# Patient Record
Sex: Female | Born: 1985 | Hispanic: Yes | Marital: Single | Smoking: Never smoker
Health system: Southern US, Community
[De-identification: ages and names within clinical notes are randomized; demographics above are authoritative.]

---

## 2005-11-22 ENCOUNTER — Emergency Department: Payer: Self-pay | Admitting: Emergency Medicine

## 2010-09-03 ENCOUNTER — Emergency Department: Payer: Self-pay | Admitting: Emergency Medicine

## 2010-10-23 ENCOUNTER — Emergency Department: Payer: Self-pay | Admitting: Emergency Medicine

## 2013-03-03 ENCOUNTER — Emergency Department: Payer: Self-pay | Admitting: Emergency Medicine

## 2014-10-24 ENCOUNTER — Emergency Department: Payer: Self-pay | Admitting: Emergency Medicine

## 2014-10-24 LAB — URINALYSIS, COMPLETE
Bacteria: NONE SEEN
Bilirubin,UR: NEGATIVE
Blood: NEGATIVE
Glucose,UR: NEGATIVE mg/dL (ref 0–75)
Ketone: NEGATIVE
Leukocyte Esterase: NEGATIVE
Nitrite: NEGATIVE
PROTEIN: NEGATIVE
Ph: 7 (ref 4.5–8.0)
RBC,UR: NONE SEEN /HPF (ref 0–5)
SPECIFIC GRAVITY: 1.019 (ref 1.003–1.030)
Squamous Epithelial: 2
WBC UR: NONE SEEN /HPF (ref 0–5)

## 2015-02-07 ENCOUNTER — Emergency Department: Payer: Self-pay | Admitting: Emergency Medicine

## 2015-07-18 ENCOUNTER — Other Ambulatory Visit: Payer: Self-pay | Admitting: Infectious Diseases

## 2015-07-18 ENCOUNTER — Ambulatory Visit
Admission: RE | Admit: 2015-07-18 | Discharge: 2015-07-18 | Disposition: A | Discharge status: Home / Self Care | Payer: PRIVATE HEALTH INSURANCE | Source: Ambulatory Visit | Attending: Infectious Diseases | Admitting: Infectious Diseases

## 2015-07-18 DIAGNOSIS — R7611 Nonspecific reaction to tuberculin skin test without active tuberculosis: Secondary | ICD-10-CM | POA: Insufficient documentation

## 2016-03-02 ENCOUNTER — Encounter: Payer: Self-pay | Admitting: Emergency Medicine

## 2016-03-02 DIAGNOSIS — N83201 Unspecified ovarian cyst, right side: Secondary | ICD-10-CM | POA: Insufficient documentation

## 2016-03-02 DIAGNOSIS — N939 Abnormal uterine and vaginal bleeding, unspecified: Secondary | ICD-10-CM | POA: Insufficient documentation

## 2016-03-02 LAB — BASIC METABOLIC PANEL
ANION GAP: 4 — AB (ref 5–15)
BUN: 27 mg/dL — ABNORMAL HIGH (ref 6–20)
CALCIUM: 9.1 mg/dL (ref 8.9–10.3)
CO2: 25 mmol/L (ref 22–32)
CREATININE: 0.55 mg/dL (ref 0.44–1.00)
Chloride: 107 mmol/L (ref 101–111)
Glucose, Bld: 106 mg/dL — ABNORMAL HIGH (ref 65–99)
Potassium: 3.4 mmol/L — ABNORMAL LOW (ref 3.5–5.1)
SODIUM: 136 mmol/L (ref 135–145)

## 2016-03-02 LAB — URINALYSIS COMPLETE WITH MICROSCOPIC (ARMC ONLY)
BILIRUBIN URINE: NEGATIVE
Bacteria, UA: NONE SEEN
GLUCOSE, UA: NEGATIVE mg/dL
KETONES UR: NEGATIVE mg/dL
LEUKOCYTES UA: NEGATIVE
NITRITE: NEGATIVE
PH: 6 (ref 5.0–8.0)
Protein, ur: NEGATIVE mg/dL
Specific Gravity, Urine: 1.02 (ref 1.005–1.030)

## 2016-03-02 LAB — CBC
HEMATOCRIT: 36.8 % (ref 35.0–47.0)
HEMOGLOBIN: 12.4 g/dL (ref 12.0–16.0)
MCH: 29.8 pg (ref 26.0–34.0)
MCHC: 33.8 g/dL (ref 32.0–36.0)
MCV: 88.1 fL (ref 80.0–100.0)
Platelets: 268 10*3/uL (ref 150–440)
RBC: 4.18 MIL/uL (ref 3.80–5.20)
RDW: 12.6 % (ref 11.5–14.5)
WBC: 11.1 10*3/uL — ABNORMAL HIGH (ref 3.6–11.0)

## 2016-03-02 NOTE — ED Notes (Signed)
Pt in with co vaginal bleeding states 3/22 had cryotherapy to cervix for abnormal cells.  States today her bleeding increased and co left lower abd pain.

## 2016-03-03 ENCOUNTER — Encounter: Payer: Self-pay | Admitting: Emergency Medicine

## 2016-03-03 ENCOUNTER — Emergency Department: Payer: Self-pay

## 2016-03-03 ENCOUNTER — Emergency Department
Admission: EM | Admit: 2016-03-03 | Discharge: 2016-03-03 | Disposition: A | Discharge status: Home / Self Care | Payer: Self-pay | Attending: Emergency Medicine | Admitting: Emergency Medicine

## 2016-03-03 DIAGNOSIS — N939 Abnormal uterine and vaginal bleeding, unspecified: Secondary | ICD-10-CM

## 2016-03-03 DIAGNOSIS — N83201 Unspecified ovarian cyst, right side: Secondary | ICD-10-CM

## 2016-03-03 DIAGNOSIS — R1030 Lower abdominal pain, unspecified: Secondary | ICD-10-CM

## 2016-03-03 LAB — CHLAMYDIA/NGC RT PCR (ARMC ONLY)
Chlamydia Tr: NOT DETECTED
N GONORRHOEAE: NOT DETECTED

## 2016-03-03 LAB — WET PREP, GENITAL
Clue Cells Wet Prep HPF POC: NONE SEEN
SPERM: NONE SEEN
Trich, Wet Prep: NONE SEEN
YEAST WET PREP: NONE SEEN

## 2016-03-03 LAB — PREGNANCY, URINE: PREG TEST UR: NEGATIVE

## 2016-03-03 MED ORDER — DIATRIZOATE MEGLUMINE & SODIUM 66-10 % PO SOLN
15.0000 mL | ORAL | Status: AC
  Administered 2016-03-03: 15 mL via ORAL

## 2016-03-03 MED ORDER — MORPHINE SULFATE (PF) 4 MG/ML IV SOLN
4.0000 mg | Freq: Once | INTRAVENOUS | Status: AC
  Administered 2016-03-03: 4 mg via INTRAVENOUS
  Filled 2016-03-03: qty 1

## 2016-03-03 MED ORDER — IOPAMIDOL (ISOVUE-300) INJECTION 61%
100.0000 mL | Freq: Once | INTRAVENOUS | Status: AC | PRN
  Administered 2016-03-03: 100 mL via INTRAVENOUS

## 2016-03-03 MED ORDER — ONDANSETRON HCL 4 MG/2ML IJ SOLN
4.0000 mg | Freq: Once | INTRAMUSCULAR | Status: AC
  Administered 2016-03-03: 4 mg via INTRAVENOUS
  Filled 2016-03-03: qty 2

## 2016-03-03 MED ORDER — DIATRIZOATE MEGLUMINE & SODIUM 66-10 % PO SOLN: 15.0000 mL | Freq: Once | ORAL | Status: DC

## 2016-03-03 MED ORDER — AMOXICILLIN-POT CLAVULANATE 875-125 MG PO TABS: 1.0000 | ORAL_TABLET | Freq: Two times a day (BID) | ORAL | Status: AC

## 2016-03-03 MED ORDER — IBUPROFEN 800 MG PO TABS: 800.0000 mg | ORAL_TABLET | Freq: Three times a day (TID) | ORAL | Status: AC | PRN

## 2016-03-03 MED ORDER — OXYCODONE-ACETAMINOPHEN 5-325 MG PO TABS: 1.0000 | ORAL_TABLET | Freq: Four times a day (QID) | ORAL | Status: AC | PRN

## 2016-03-03 NOTE — ED Notes (Signed)
Pt to ct scan at this time.

## 2016-03-03 NOTE — ED Provider Notes (Signed)
[facility] Emergency Department Provider Note  ____________________________________________  Time seen: Approximately 3:17 AM  I have reviewed the triage vital signs and the nursing notes.   HISTORY  Chief Complaint Abdominal Pain    HPI Crystal Montgomery is a 30 y.o. female comes into the hospital today with vaginal bleeding. The patient reports that the bleeding became very heavy around 8 PM. She reports that her last menstrual period was in December and she is unsure how many pads she soaked before coming in. She reports that it seemed as though someone opened a faucet and blood was pouring out of her. She reports that when she arrived the bleeding subsided. She reports that she also has some abdominal pain that sent out of 10 in intensity at its worst in her left lower abdomen. She reports that she hasn't OB/GYN at [facility]. She reports that she had a Pap smear done and was sent to [facility] on February 22 when they found some abnormal cells. She reports that she had a colposcopy done and was told that her cells were abnormal. She had another procedure done on March 22 took out a larger sample of cells and burned the inside of her uterus. She reports that she was told that there would be some bleeding but not this heavy. Patient took some ibuprofen for her pain but reports that did not help. The patient is here for evaluation. She's had similar pain with an ovarian cyst previously but reports that she's not sure if this is the same thing her something different related to her procedure.   History reviewed. No pertinent past medical history.  There are no active problems to display for this patient.   History reviewed. No pertinent past surgical history.  No current outpatient prescriptions on file.  Allergies Review of patient's allergies indicates no known allergies.  No family history on file.  Social History Social History  Substance Use Topics  .  Smoking status: Never Smoker   . Smokeless tobacco: None  . Alcohol Use: No    Review of Systems Constitutional: No fever/chills Eyes: No visual changes. ENT: No sore throat. Cardiovascular: Denies chest pain. Respiratory: Denies shortness of breath. Gastrointestinal: abdominal pain.  No nausea, no vomiting.  No diarrhea.  No constipation. Genitourinary: Vaginal bleeding Musculoskeletal: Negative for back pain. Skin: Negative for rash. Neurological: Negative for headaches, focal weakness or numbness.  10-point ROS otherwise negative.  ____________________________________________   PHYSICAL EXAM:  VITAL SIGNS: ED Triage Vitals  Enc Vitals Group     BP 03/02/16 2324 119/83 mmHg     Pulse Rate 03/02/16 2324 66     Resp 03/02/16 2324 18     Temp 03/02/16 2324 97.8 F (36.6 C)     Temp Source 03/02/16 2324 Oral     SpO2 03/02/16 2324 96 %     Weight 03/02/16 2324 120 lb (54.432 kg)     Height 03/02/16 2324 5' (1.524 m)     Head Cir --      Peak Flow --      Pain Score 03/02/16 2325 8     Pain Loc --      Pain Edu? --      Excl. in GC? --     Constitutional: Alert and oriented. Well appearing and in watery distress. Eyes: Conjunctivae are normal. PERRL. EOMI. Head: Atraumatic. Nose: No congestion/rhinnorhea. Mouth/Throat: Mucous membranes are moist.  Oropharynx non-erythematous. Cardiovascular: Normal rate, regular rhythm. Grossly normal heart  sounds.  Good peripheral circulation. Respiratory: Normal respiratory effort.  No retractions. Lungs CTAB. Gastrointestinal: Soft diffuse lower abdominal tenderness to palpation. Mild lower abdomen distention. Active bowel sounds Genitourinary: Normal external genitalia, eschar noted to cervical area, cervical motion tenderness, uterine and bilateral adnexal tenderness to palpation Musculoskeletal: No lower extremity tenderness nor edema.  Neurologic:  Normal speech and language.  Skin:  Skin is warm, dry and intact.   Psychiatric: Mood and affect are normal.   ____________________________________________   LABS (all labs ordered are listed, but only abnormal results are displayed)  Labs Reviewed  WET PREP, GENITAL - Abnormal; Notable for the following:    WBC, Wet Prep HPF POC FEW (*)    All other components within normal limits  CBC - Abnormal; Notable for the following:    WBC 11.1 (*)    All other components within normal limits  BASIC METABOLIC PANEL - Abnormal; Notable for the following:    Potassium 3.4 (*)    Glucose, Bld 106 (*)    BUN 27 (*)    Anion gap 4 (*)    All other components within normal limits  URINALYSIS COMPLETEWITH MICROSCOPIC ([facility] ONLY) - Abnormal; Notable for the following:    Color, Urine YELLOW (*)    APPearance CLEAR (*)    Hgb urine dipstick 2+ (*)    Squamous Epithelial / LPF 0-5 (*)    All other components within normal limits  CHLAMYDIA/NGC RT PCR ([facility] ONLY)  PREGNANCY, URINE   ____________________________________________  EKG  None ____________________________________________  RADIOLOGY  Pelvic ultrasound: No acute abnormality seen to explain the patient's symptoms, no evidence for ovarian torsion, trace fluid noted at the cervical canal. ____________________________________________   PROCEDURES  Procedure(s) performed: None  Critical Care performed: No  ____________________________________________   INITIAL IMPRESSION / ASSESSMENT AND PLAN / ED COURSE  Pertinent labs & imaging results that were available during my care of the patient were reviewed by me and considered in my medical decision making (see chart for details).  This is a 30 year old female who comes in with severe vaginal bleeding as well as lower abdominal pain that started this evening. The patient had been having some crampy pain previously but reports that this pain is worse. The patient does not have any severe bleeding on exam but I did send her for an  ultrasound to evaluate her symptoms. The patient's blood work at this time is negative. I did give the patient a dose of morphine and Zofran as she was very uncomfortable.  The patient's ultrasound is negative. Given though the patient's amount of pain and her lower abdominal distention I will do a CT scan to evaluate the patient's belly pain. The patient's care was signed out to Dr. Sharma CovertNorman who will follow-up the results of the patient's CT scan and disposition the patient. If the CT scan is negative I will consider treating the patient for endometritis. ____________________________________________   FINAL CLINICAL IMPRESSION(S) / ED DIAGNOSES  Final diagnoses:  Vaginal bleeding  Lower abdominal pain      Rebecka ApleyAllison P Webster, MD 03/03/16 [phone]

## 2016-03-03 NOTE — ED Notes (Signed)
Pt states she has been taking ibuprofen 400mg  for pain control.

## 2016-03-03 NOTE — Discharge Instructions (Signed)
Please take the entire course of antibiotics, even if you're feeling better. You may take Motrin or Tylenol for mild to moderate pain. Take Percocet for severe pain. Do not drive within 8 hours of taking Percocet.  Return to the emergency department if you develop severe pain, inability to keep down fluids, fever, or any other symptoms concerning to you.

## 2016-03-03 NOTE — ED Provider Notes (Signed)
I received sign out about this patient from Dr. Zenda AlpersWebster. 30 year old female who is coming in for vaginal bleeding and lower abdominal pain. She had had a reassuring examination in the emergency department her vital signs are stable, her labs are reassuring, and she has no ultrasound and CT scan which do not show any acute process. I have reevaluated the patient myself, and she is feeling much better. Her abdomen at this point has very minimal suprapubic discomfort but no guarding or rebound, no distention. I will discharge her home with Augmentin for the treatment of endometritis, as well as medications for symptomatically management of her pain. She will follow-up with her gynecologist. She understands return precautions as well as follow-up instructions.  Crystal MenghiniAnne-Caroline Ishi Danser, MD 03/03/16 [phone]

## 2016-03-03 NOTE — ED Notes (Signed)
Pt states she had colposcopy on March 22nd and she was told that she would bleed.  Bleeding has increased and is having at times half dollar sized clots coming out.  Pt states she has had to change her sanitary pad numerous times due to saturation.  Pt had some lightheadedness earlier, but not currently experiencing this.  Pt states the pain is bad around her LLQ of her abdomen by her ovary area.  Pt denies this being a normal time for her period either.

## 2016-07-27 IMAGING — CT CT ABD-PELV W/ CM
1 of 3 series · 14 of 32 positions shown, 18 images · IV contrast (iopamidol)
Comparison: Pelvic ultrasound 03/03/2016

CLINICAL DATA: Lower abdominal pain, vaginal bleeding

EXAM:
CT ABDOMEN AND PELVIS WITH CONTRAST
TECHNIQUE: Multidetector CT imaging of the abdomen and pelvis was performed
using the standard protocol following bolus administration of
intravenous contrast.
CONTRAST:  100mL [id number] IOPAMIDOL (BDKR45-Q33) INJECTION 61%

[Series 2: routine abd pel with · axial · 0.61mm/px · z∈[-878,-508]mm · 14 of 82 slices shown, 18 images]
[im 4/82  soft-tissue]
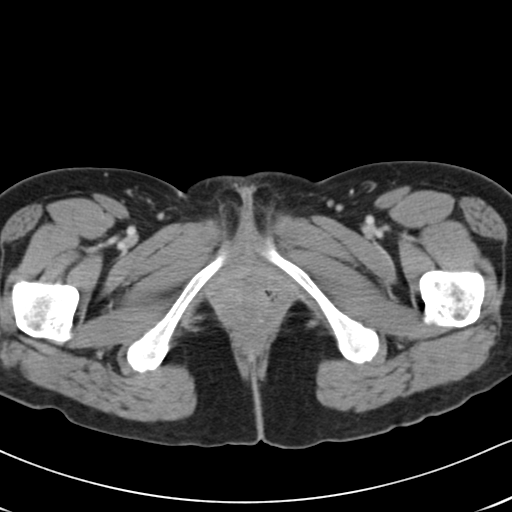
[im 4/82  bone]
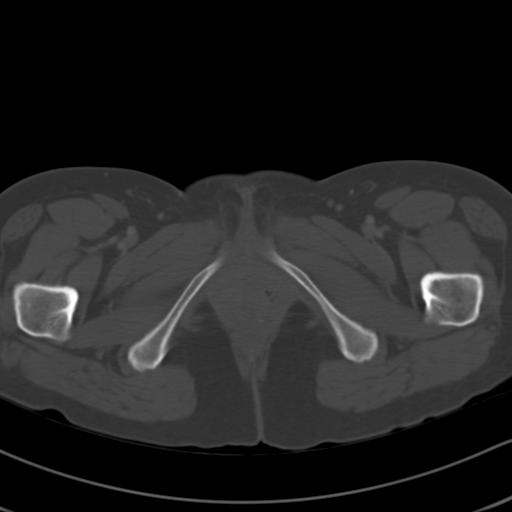
[im 12/82  soft-tissue]
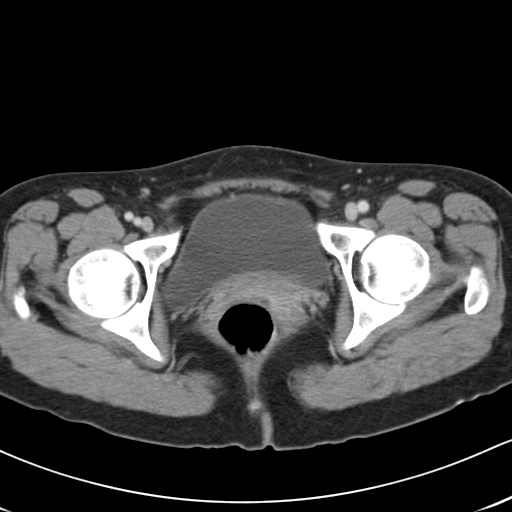
[im 20/82  soft-tissue]
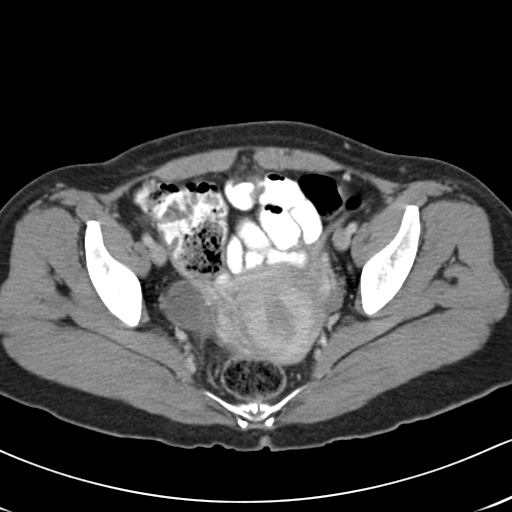
[im 24/82  soft-tissue]
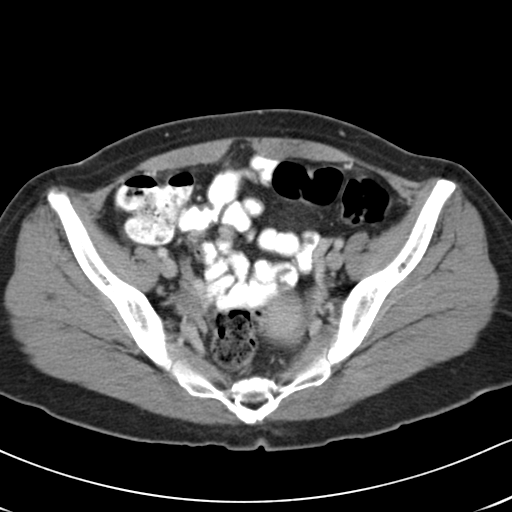
[im 31/82  soft-tissue]
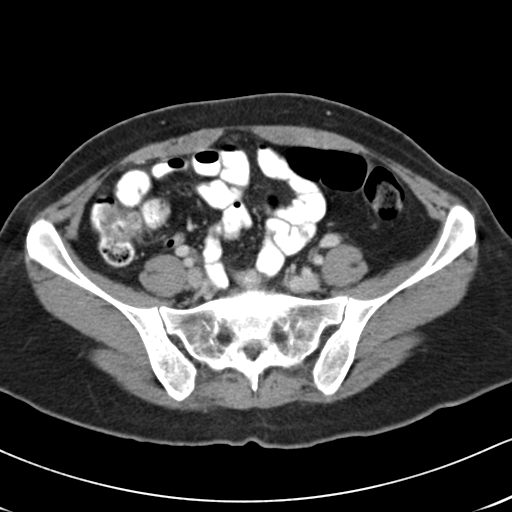
[im 39/82  soft-tissue]
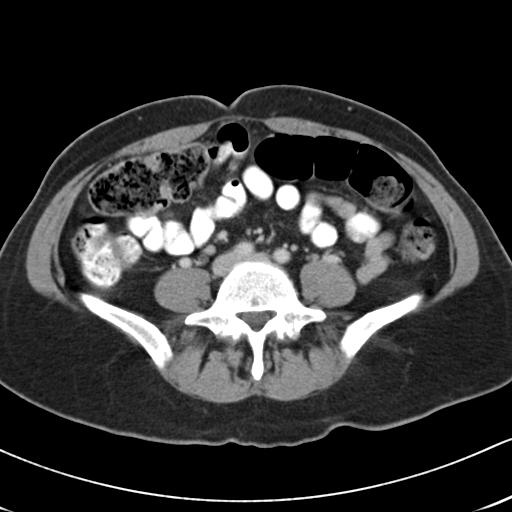
[im 43/82  soft-tissue]
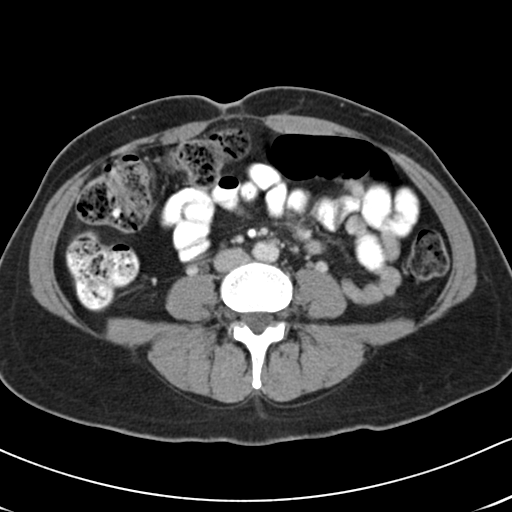
[im 51/82  soft-tissue]
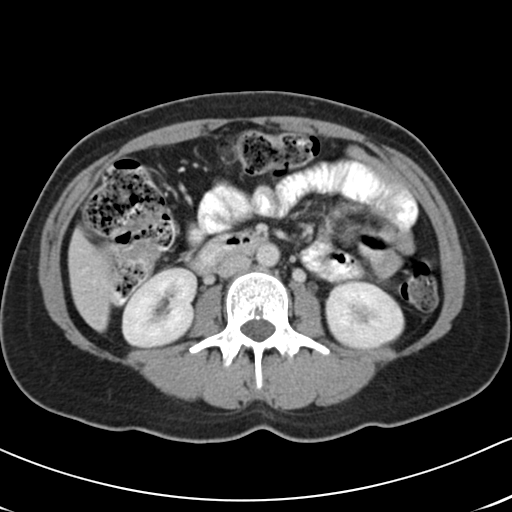
[im 58/82  soft-tissue]
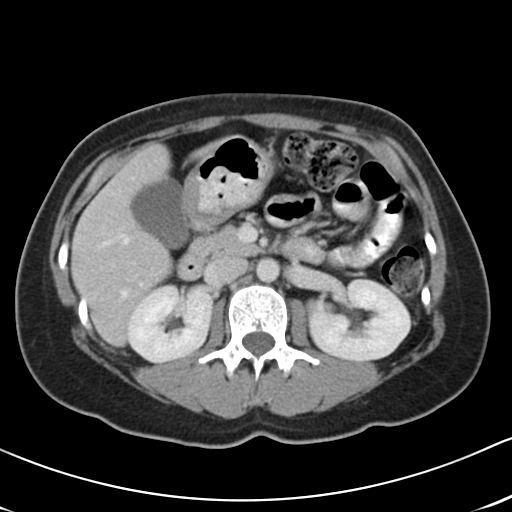
[im 58/82  bone]
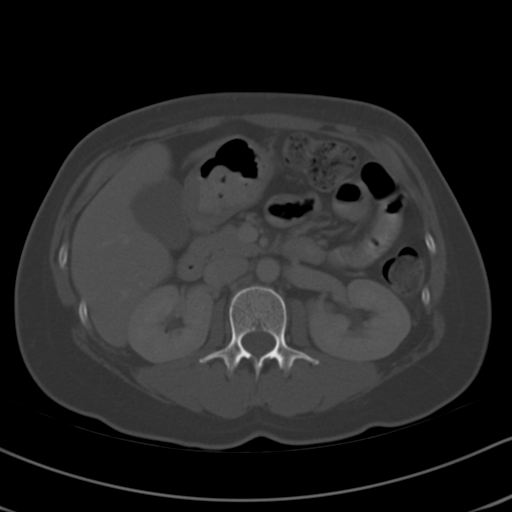
[im 62/82  soft-tissue]
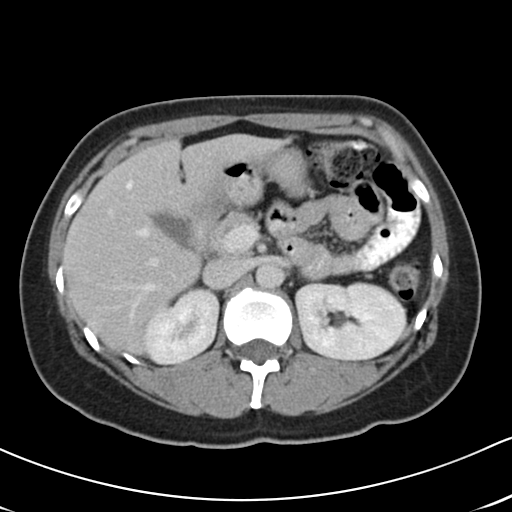
[im 66/82  lung]
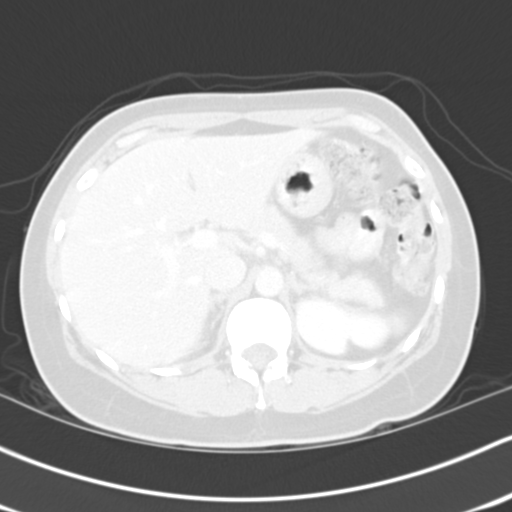
[im 70/82  soft-tissue]
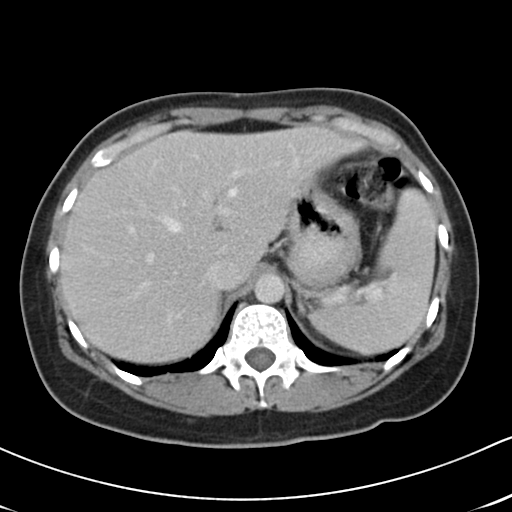
[im 70/82  lung]
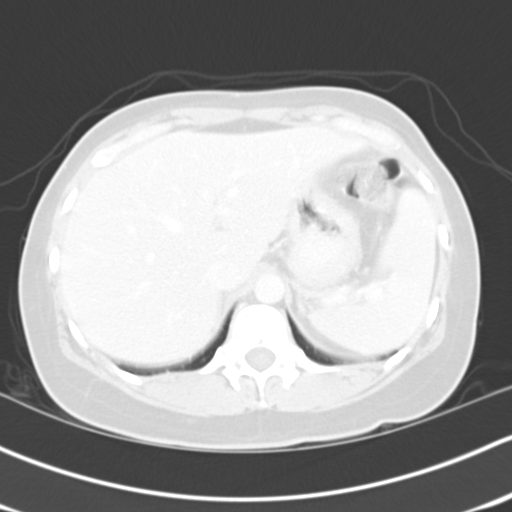
[im 74/82  lung]
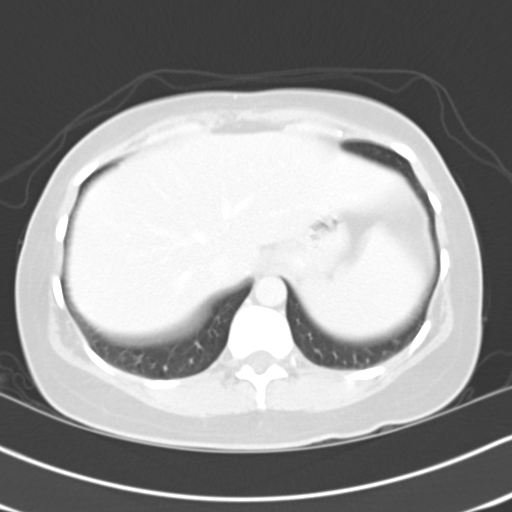
[im 78/82  soft-tissue]
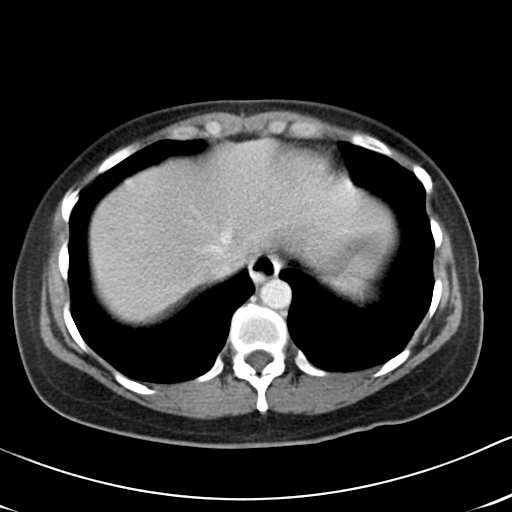
[im 78/82  lung]
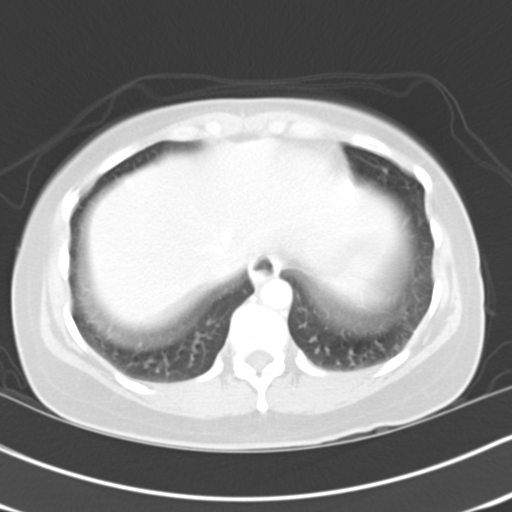

[14 of 32 positions shown; findings below may reference images not displayed]

FINDINGS: Lower chest: Small hiatal hernia is noted. The lung bases are
unremarkable.

Hepatobiliary: Enhanced liver shows no focal mass. No calcified
gallstones are noted within gallbladder. No intrahepatic biliary
ductal dilatation. No CBD dilatation.

Pancreas: No mass, inflammatory changes, or other significant
abnormality.

Spleen: Within normal limits in size and appearance.

Adrenals/Urinary Tract: No adrenal gland mass. Enhanced kidneys are
symmetrical in size. No hydronephrosis or hydroureter.

No focal renal mass. No perinephric stranding. The urinary bladder
is unremarkable. Bilateral visualized ureter is unremarkable.

Stomach/Bowel: No gastric outlet obstruction. No small bowel
obstruction. No thickened or dilated small bowel loops. Normal
appendix is noted in axial image 56. No pericecal inflammation.
Terminal ileum is unremarkable.

There is a low lying cecum. No mesenteric fluid collection is noted.

Vascular/Lymphatic: No aortic aneurysm. IVC is unremarkable. A tiny
calcified retroperitoneal lymph node is noted just anterior to aorta
axial image 39 measures 6 mm.

Reproductive: There is a retroflexed uterus. Small amount of
nonspecific fluid is noted within uterus and endocervical canal.
There is a right ovarian cyst measures 2.8 cm. No pelvic free fluid
is noted.

Other: No ascites or free air.  No inguinal adenopathy.

Musculoskeletal: Sagittal images of the spine are unremarkable. No
destructive bony lesions are noted. No destructive bony lesions are
noted within pelvis.
IMPRESSION: 1. There is no evidence of acute inflammatory process within
abdomen.
2. No hydronephrosis or hydroureter.
3. Moderate stool noted throughout the colon. No small bowel or
colonic obstruction.
4. Low lying cecum.  No pericecal inflammation.  Normal appendix.
5. There is a retroflexed uterus. No adnexal mass. There is a right
ovarian cyst measures 2.8 cm. No pelvic free fluid. Small
nonspecific fluid is noted within uterus and endocervical canal.

## 2018-01-30 ENCOUNTER — Emergency Department
Admission: EM | Admit: 2018-01-30 | Discharge: 2018-01-31 | Disposition: A | Discharge status: Home / Self Care | Payer: PRIVATE HEALTH INSURANCE

## 2018-01-30 NOTE — ED Notes (Signed)
Pt was called x3 by first nurse , Gearldine BienenstockBrandy Rn. No answer

## 2021-08-29 ENCOUNTER — Emergency Department
Admission: EM | Admit: 2021-08-29 | Discharge: 2021-08-29 | Disposition: A | Discharge status: Home / Self Care | Payer: PRIVATE HEALTH INSURANCE | Attending: Emergency Medicine | Admitting: Emergency Medicine

## 2021-08-29 ENCOUNTER — Emergency Department: Payer: PRIVATE HEALTH INSURANCE

## 2021-08-29 ENCOUNTER — Other Ambulatory Visit: Payer: Self-pay

## 2021-08-29 ENCOUNTER — Encounter: Payer: Self-pay | Admitting: Emergency Medicine

## 2021-08-29 DIAGNOSIS — Z3A01 Less than 8 weeks gestation of pregnancy: Secondary | ICD-10-CM | POA: Insufficient documentation

## 2021-08-29 DIAGNOSIS — O200 Threatened abortion: Secondary | ICD-10-CM | POA: Insufficient documentation

## 2021-08-29 DIAGNOSIS — O4690 Antepartum hemorrhage, unspecified, unspecified trimester: Secondary | ICD-10-CM

## 2021-08-29 LAB — CBC WITH DIFFERENTIAL/PLATELET
Abs Immature Granulocytes: 0.12 10*3/uL — ABNORMAL HIGH (ref 0.00–0.07)
Basophils Absolute: 0 10*3/uL (ref 0.0–0.1)
Basophils Relative: 0 %
Eosinophils Absolute: 0.1 10*3/uL (ref 0.0–0.5)
Eosinophils Relative: 1 %
HCT: 36.4 % (ref 36.0–46.0)
Hemoglobin: 12.7 g/dL (ref 12.0–15.0)
Immature Granulocytes: 1 %
Lymphocytes Relative: 26 %
Lymphs Abs: 2.3 10*3/uL (ref 0.7–4.0)
MCH: 31.5 pg (ref 26.0–34.0)
MCHC: 34.9 g/dL (ref 30.0–36.0)
MCV: 90.3 fL (ref 80.0–100.0)
Monocytes Absolute: 0.5 10*3/uL (ref 0.1–1.0)
Monocytes Relative: 5 %
Neutro Abs: 6 10*3/uL (ref 1.7–7.7)
Neutrophils Relative %: 67 %
Platelets: 315 10*3/uL (ref 150–400)
RBC: 4.03 MIL/uL (ref 3.87–5.11)
RDW: 12.3 % (ref 11.5–15.5)
WBC: 9 10*3/uL (ref 4.0–10.5)
nRBC: 0 % (ref 0.0–0.2)

## 2021-08-29 LAB — HCG, QUANTITATIVE, PREGNANCY: hCG, Beta Chain, Quant, S: 9407 m[IU]/mL — ABNORMAL HIGH (ref <5)

## 2021-08-29 LAB — ABO/RH: ABO/RH(D): B POS

## 2021-08-29 NOTE — Discharge Instructions (Signed)
Como comentamos, su ecografa mostr un Psychiatrist de 6 semanas de edad gestacional. En este momento, no hay latido del corazn. Puede deberse a que esto es [address] o a que el embarazo ha fallado y, por lo tanto, est teniendo un aborto espontneo. Es muy importante que se repita el anlisis de sangre y la ecografa en una semana para determinar qu est pasando. Mientras tanto, asegrese de no llevar nada que pese ms de 5 libras, evite las [employer] sexuales y los tampones hasta que transcurran 48 horas sin sangrado. Regrese a la sala de emergencias si tiene sangrado intenso hasta el punto de sentir que se va a Artist, tiene Surveyor, quantity pecho o le falta el aire. Contine tomando vitaminas prenatales. Toma Tylenol para los calambres   As we discussed, your ultrasound showed a pregnancy of [redacted] weeks gestational age.  At this time, there is no heartbeat.  It could be due to the fact that this is too early or to the fact that the pregnancy has failed and therefore you are having a miscarriage.  It is very important that you have repeat blood work and ultrasound in a week to determine what is going on.  In the meantime make sure not to carry anything heavier than 5 pounds, avoid sex and tampons until 48 hours of no bleeding.  Return to the emergency room if you have severe bleeding to the point that you feel like you are going to pass out, have chest pain or shortness of breath.  Continue to take prenatal vitamins.  Take Tylenol for cramping

## 2021-08-29 NOTE — ED Provider Notes (Signed)
[facility] Emergency Department Provider Note  ____________________________________________  Time seen: Approximately 11:33 PM  I have reviewed the triage vital signs and the nursing notes.   HISTORY  Chief Complaint Vaginal Bleeding   HPI Crystal Montgomery is a 35 y.o. female G4P3 at [redacted] weeks GA per LMP who presents for evaluation of vaginal bleeding.  The vaginal bleeding started earlier today.  She was passing large clots and having a lot of cramping in the suprapubic and lower back.  She went to [facility] clinic where they did some blood work to confirm the pregnancy but no imaging.  Patient reports that when she went home the bleeding got more severe which prompted her visit to the emergency room.  She reports that at this time the bleeding has slowed down and the cramping has subsided.  She denies any prior problems with her first 3 pregnancies.  She has no history of bleeding disorder and does not take any blood thinners.   No past medical history on file.  There are no problems to display for this patient.   No past surgical history on file.  Prior to Admission medications   Medication Sig Start Date End Date Taking? Authorizing Provider  ibuprofen (ADVIL,MOTRIN) 800 MG tablet Take 1 tablet (800 mg total) by mouth every 8 (eight) hours as needed. 03/03/16   Rockne Menghini, MD    Allergies Patient has no known allergies.  No family history on file.  Social History Social History   Tobacco Use   Smoking status: Never  Substance Use Topics   Alcohol use: No    Review of Systems  Constitutional: Negative for fever. Eyes: Negative for visual changes. ENT: Negative for sore throat. Neck: No neck pain  Cardiovascular: Negative for chest pain. Respiratory: Negative for shortness of breath. Gastrointestinal: Negative for abdominal pain, vomiting or diarrhea. Genitourinary: Negative for dysuria. + vaginal  bleeding Musculoskeletal: Negative for back pain. Skin: Negative for rash. Neurological: Negative for headaches, weakness or numbness. Psych: No SI or HI  ____________________________________________   PHYSICAL EXAM:  VITAL SIGNS: Vitals:   08/29/21 2108 08/29/21 2341  BP: (!) 177/96 (!) 106/93  Pulse: 64 75  Resp: 16 16  Temp: 98.7 F (37.1 C)   SpO2: 100%      Constitutional: Alert and oriented. Well appearing and in no apparent distress. HEENT:      Head: Normocephalic and atraumatic.         Eyes: Conjunctivae are normal. Sclera is non-icteric.       Mouth/Throat: Mucous membranes are moist.       Neck: Supple with no signs of meningismus. Cardiovascular: Regular rate and rhythm. No murmurs, gallops, or rubs. 2+ symmetrical distal pulses are present in all extremities. No JVD. Respiratory: Normal respiratory effort. Lungs are clear to auscultation bilaterally.  Gastrointestinal: Soft, non tender, and non distended with positive bowel sounds. No rebound or guarding. Genitourinary: No CVA tenderness. Musculoskeletal:  No edema, cyanosis, or erythema of extremities. Neurologic: Normal speech and language. Face is symmetric. Moving all extremities. No gross focal neurologic deficits are appreciated. Skin: Skin is warm, dry and intact. No rash noted. Psychiatric: Mood and affect are normal. Speech and behavior are normal.  ____________________________________________   LABS (all labs ordered are listed, but only abnormal results are displayed)  Labs Reviewed  HCG, QUANTITATIVE, PREGNANCY - Abnormal; Notable for the following components:      Result Value   hCG, Beta Chain, Quant, S 9,407 (*)  All other components within normal limits  CBC WITH DIFFERENTIAL/PLATELET - Abnormal; Notable for the following components:   Abs Immature Granulocytes 0.12 (*)    All other components within normal limits  ABO/RH   ____________________________________________  EKG  none   ____________________________________________  RADIOLOGY  I have personally reviewed the images performed during this visit and I agree with the Radiologist's read.   Interpretation by Radiologist:  US OB LESS THAN 14 WEEKS WITH OB TRANSVAGINAL  Result Date: 08/29/2021 CLINICAL DATA:  Vaginal bleeding. EXAM: OBSTETRIC <14 WK [place] AND TRANSVAGINAL OB US TECHNIQUE: Both transabdominal and transvaginal ultrasound examinations were performed for complete evaluation of the gestation as well as the maternal uterus, adnexal regions, and pelvic cul-de-sac. Transvaginal technique was performed to assess early pregnancy. COMPARISON:  None. FINDINGS: Intrauterine gestational sac: Single Yolk sac:  Visualized. Embryo:  Visualized. Cardiac Activity: Not Visualized. Heart Rate: N/A  bpm CRL:  3.6 mm   6 w   0 d                  [place] [facility]: Apr 24, 2022 Subchorionic hemorrhage:  None visualized. Maternal uterus/adnexae: The right ovary measures 2.4 cm x 1.8 cm x 1.4 cm and is normal in appearance. The left ovary measures 2.6 cm x 2.6 cm x 2.5 cm and is normal in appearance. No pelvic free fluid is seen. IMPRESSION: Single intrauterine pregnancy at approximately 6 weeks and 0 days gestation by ultrasound evaluation, without evidence of fetal cardiac activity. Findings are suspicious but not yet definitive for failed pregnancy. Recommend follow-up US in 10-14 days for definitive diagnosis. This recommendation follows SRU consensus guidelines: Diagnostic Criteria for Nonviable Pregnancy Early in the First Trimester. Malva Limes Med 2013; 992:4268-34. Electronically Signed   By: Aram Candela M.D.   On: 08/29/2021 22:42     ____________________________________________   PROCEDURES  Procedure(s) performed: None Procedures   Critical Care performed:  None ____________________________________________   INITIAL IMPRESSION / ASSESSMENT AND PLAN / ED COURSE  35 y.o. female G4P3 at [redacted] weeks GA per LMP who presents for  evaluation of vaginal bleeding.  Patient is hemodynamically stable with normal hemoglobin.  Blood type a positive and no indication for RhoGAM.  hCG hormones at 9407.  Transvaginal ultrasound showing single IUP measuring [redacted] weeks gestational age but no signs of fetal cardiac activity.  Discussed with the patient that she needs an ultrasound in a week to determine if this is a normal pregnancy that is developing or a failed pregnancy.  Will refer [facility] to [facility] for follow-up.  Discussed my return precautions for any signs of acute blood loss anemia.  Otherwise supportive care and pelvic rest until bleeding subsides.      _____________________________________________ Please note:  Patient was evaluated in Emergency Department today for the symptoms described in the history of present illness. Patient was evaluated in the context of the global COVID-19 pandemic, which necessitated consideration that the patient might be at risk for infection with the SARS-CoV-2 virus that causes COVID-19. Institutional protocols and algorithms that pertain to the evaluation of patients at risk for COVID-19 are in a state of rapid change based on information released by regulatory bodies including the CDC and federal and state organizations. These policies and algorithms were followed during the patient's care in the ED.  Some ED evaluations and interventions may be delayed as a result of limited staffing during the pandemic.   [place] Controlled Substance Database was reviewed by me. ____________________________________________  FINAL CLINICAL IMPRESSION(S) / ED DIAGNOSES   Final diagnoses:  Threatened miscarriage      NEW MEDICATIONS STARTED DURING THIS VISIT:  ED Discharge Orders     None        Note:  This document was prepared using Dragon voice recognition software and may include unintentional dictation errors.    Don Perking, [place], MD 08/29/21 [phone]

## 2021-08-29 NOTE — ED Triage Notes (Signed)
Pt states is having vaginal bleeding that began several days ago. Pt states was seen at clinic today for bleeding and told to return tomorrow for same. Pt states is [redacted] weeks pregnant. Pt was told at clinic "is in danger of a misscarriage".

## 2021-08-29 NOTE — ED Notes (Signed)
Triage with assist of spanish interpreter mark # [phone] with [facility] interpreter.

## 2022-01-22 IMAGING — US US OB < 14 WEEKS - US OB TV
1 series · 13 of 28 positions shown · non-contrast
Comparison: None.

CLINICAL DATA: Vaginal bleeding.

EXAM:
OBSTETRIC <14 WK US AND TRANSVAGINAL OB US
TECHNIQUE: Both transabdominal and transvaginal ultrasound examinations were
performed for complete evaluation of the gestation as well as the
maternal uterus, adnexal regions, and pelvic cul-de-sac.
Transvaginal technique was performed to assess early pregnancy.

[Series 1: us ob comp less 14 wks · 13 of 114 slices shown]
[im 5/114]
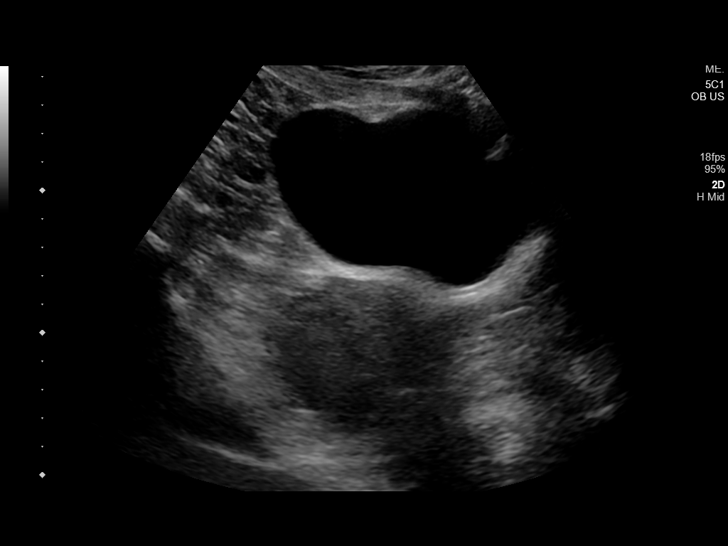
[im 13/114]
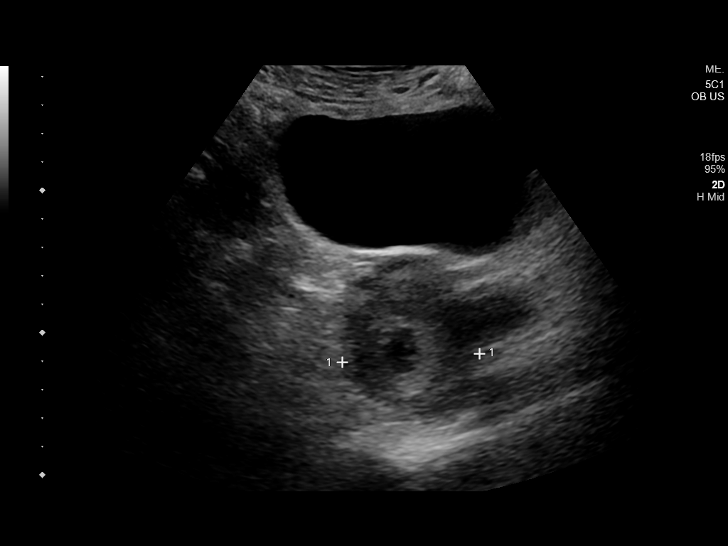
[im 21/114]
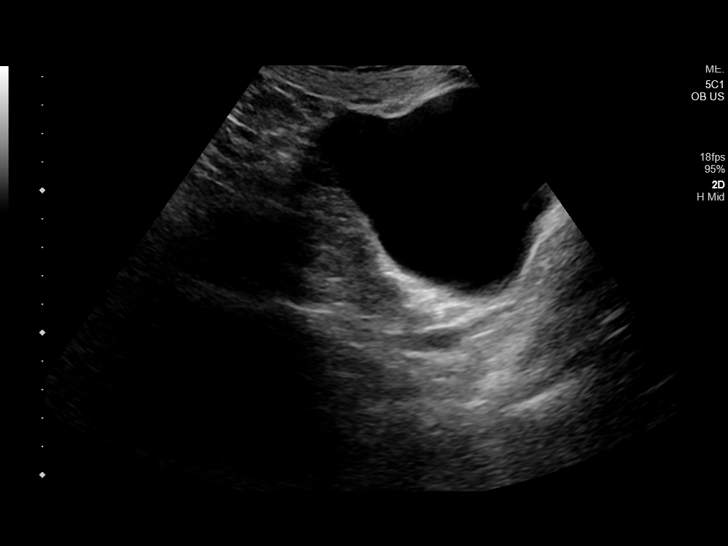
[im 30/114]
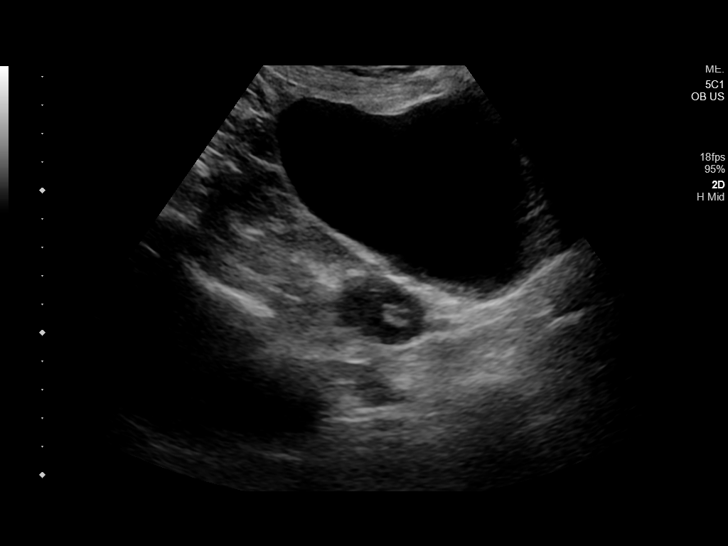
[im 38/114]
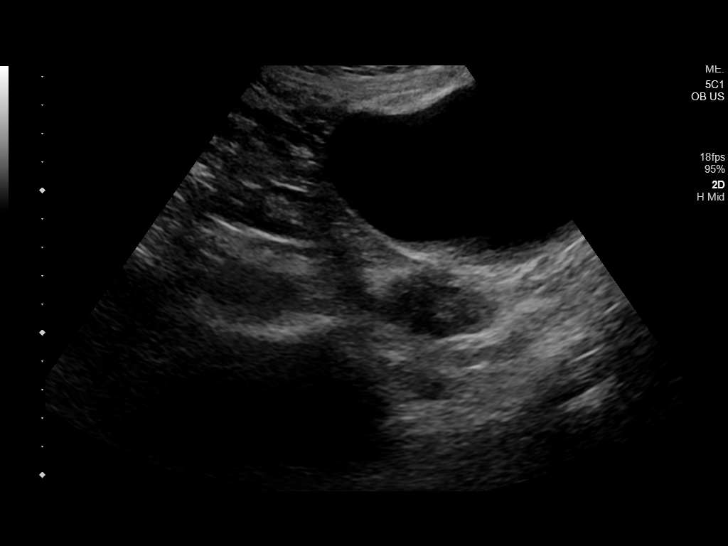
[im 47/114]
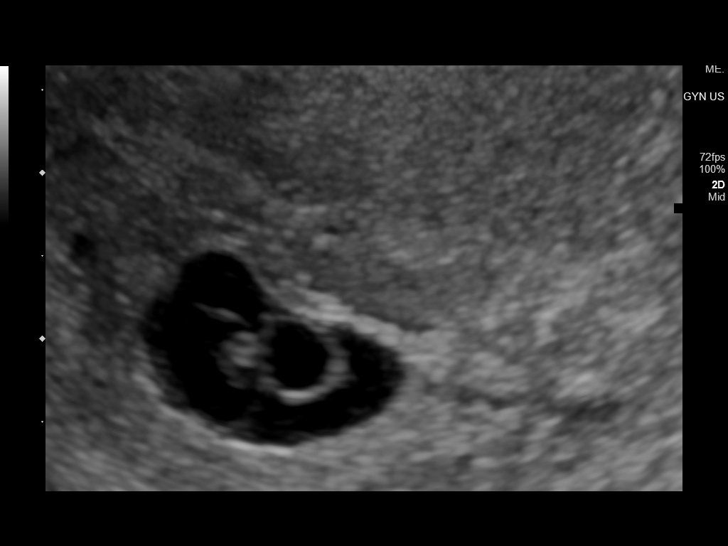
[im 59/114]
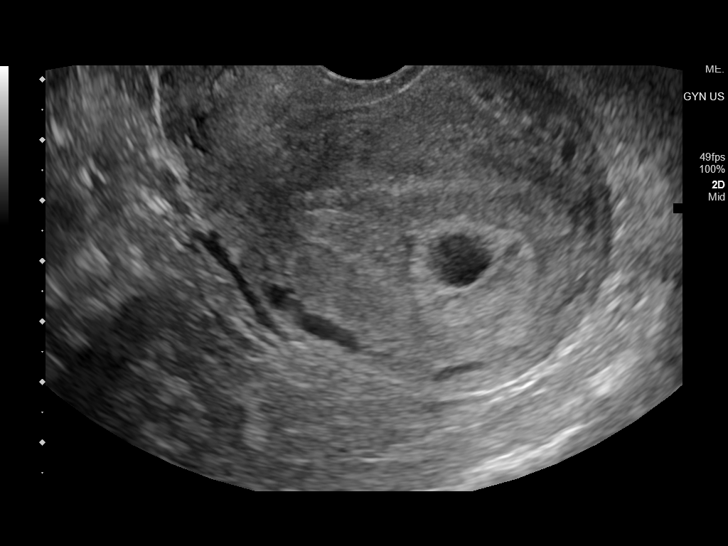
[im 67/114]
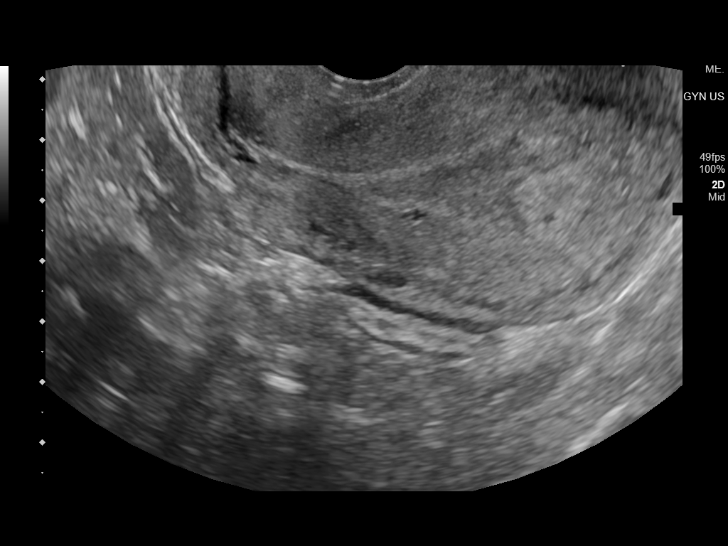
[im 76/114]
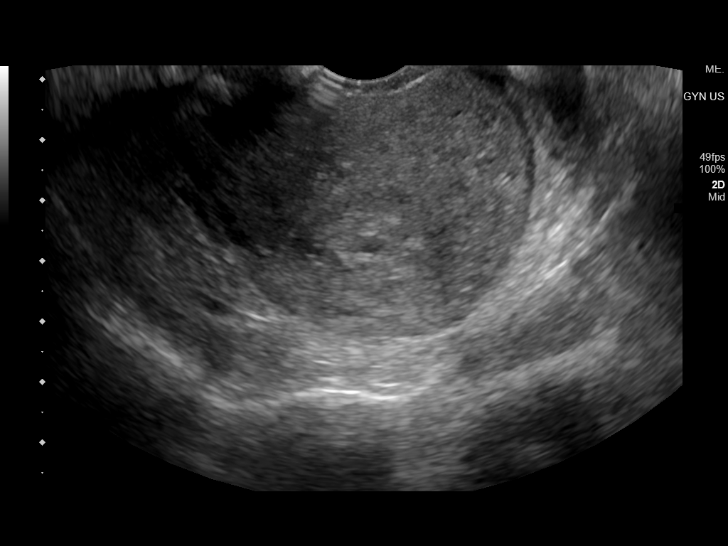
[im 84/114]
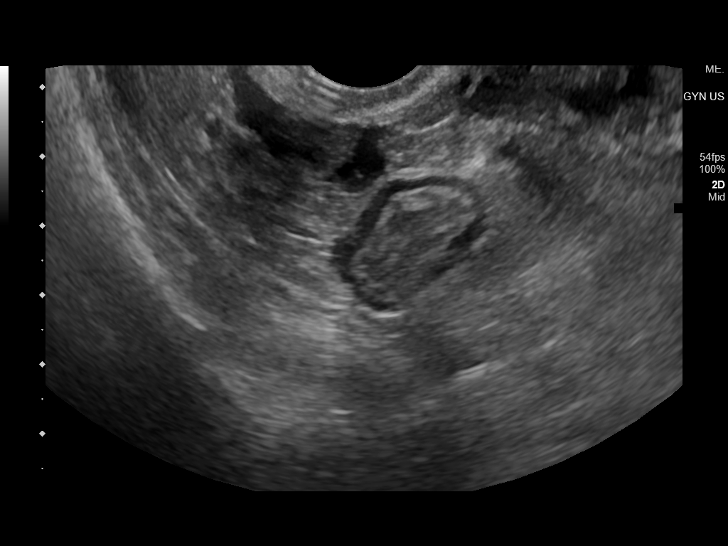
[im 93/114]
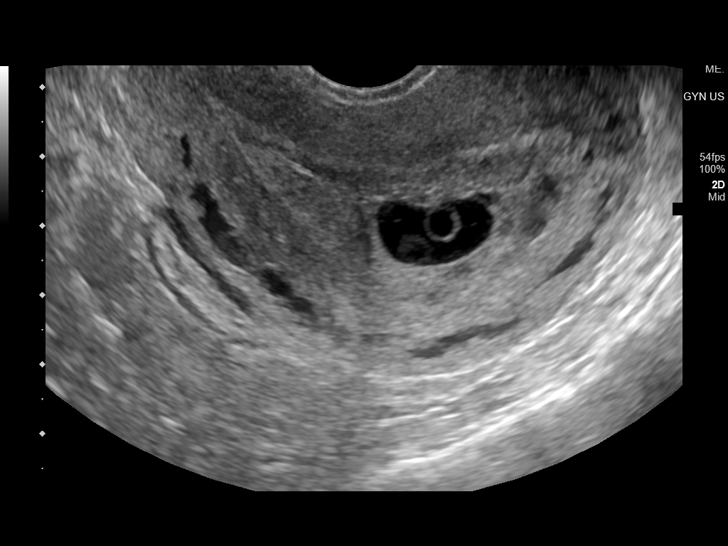
[im 101/114]
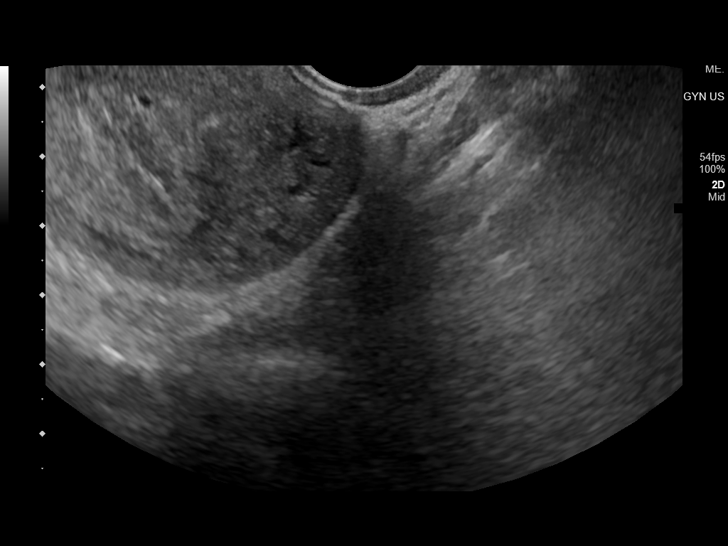
[im 109/114]
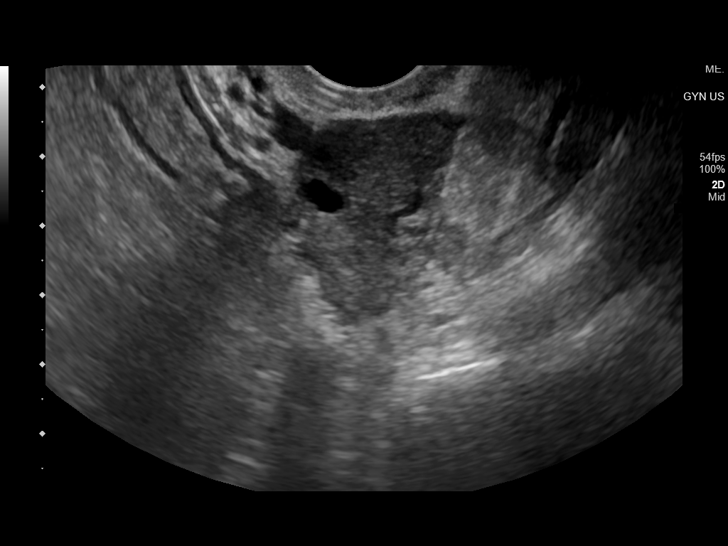

[13 of 28 positions shown; findings below may reference images not displayed]

FINDINGS: Intrauterine gestational sac: Single

Yolk sac:  Visualized.

Embryo:  Visualized.

Cardiac Activity: Not Visualized.

Heart Rate: N/A  bpm

CRL:  3.6 mm   6 w   0 d                  US [facility]: April 24, 2022

Subchorionic hemorrhage:  None visualized.

Maternal uterus/adnexae: The right ovary measures 2.4 cm x 1.8 cm x
1.4 cm and is normal in appearance.

The left ovary measures 2.6 cm x 2.6 cm x 2.5 cm and is normal in
appearance.

No pelvic free fluid is seen.
IMPRESSION: Single intrauterine pregnancy at approximately 6 weeks and 0 days
gestation by ultrasound evaluation, without evidence of fetal
cardiac activity. Findings are suspicious but not yet definitive for
failed pregnancy. Recommend follow-up US in 10-14 days for
definitive diagnosis. This recommendation follows SRU consensus
guidelines: Diagnostic Criteria for Nonviable Pregnancy Early in the
First Trimester. N Engl J Med 6138; [DATE].

## 2022-08-03 ENCOUNTER — Emergency Department
Admission: EM | Admit: 2022-08-03 | Discharge: 2022-08-03 | Disposition: A | Discharge status: Home / Self Care | Payer: Self-pay | Attending: Emergency Medicine | Admitting: Emergency Medicine

## 2022-08-03 ENCOUNTER — Emergency Department: Payer: Self-pay

## 2022-08-03 ENCOUNTER — Other Ambulatory Visit: Payer: Self-pay

## 2022-08-03 DIAGNOSIS — M545 Low back pain, unspecified: Secondary | ICD-10-CM | POA: Insufficient documentation

## 2022-08-03 DIAGNOSIS — N83202 Unspecified ovarian cyst, left side: Secondary | ICD-10-CM | POA: Insufficient documentation

## 2022-08-03 DIAGNOSIS — R1032 Left lower quadrant pain: Secondary | ICD-10-CM

## 2022-08-03 LAB — COMPREHENSIVE METABOLIC PANEL
ALT: 17 U/L (ref 0–44)
AST: 19 U/L (ref 15–41)
Albumin: 4.4 g/dL (ref 3.5–5.0)
Alkaline Phosphatase: 66 U/L (ref 38–126)
Anion gap: 6 (ref 5–15)
BUN: 8 mg/dL (ref 6–20)
CO2: 26 mmol/L (ref 22–32)
Calcium: 9.2 mg/dL (ref 8.9–10.3)
Chloride: 106 mmol/L (ref 98–111)
Creatinine, Ser: 0.46 mg/dL (ref 0.44–1.00)
GFR, Estimated: >60 mL/min (ref >60)
Glucose, Bld: 106 mg/dL — ABNORMAL HIGH (ref 70–99)
Potassium: 3.6 mmol/L (ref 3.5–5.1)
Sodium: 138 mmol/L (ref 135–145)
Total Bilirubin: 0.7 mg/dL (ref 0.3–1.2)
Total Protein: 8.1 g/dL (ref 6.5–8.1)

## 2022-08-03 LAB — POC URINE PREG, ED: Preg Test, Ur: NEGATIVE

## 2022-08-03 LAB — CBC WITH DIFFERENTIAL/PLATELET
Abs Immature Granulocytes: 0.07 10*3/uL (ref 0.00–0.07)
Basophils Absolute: 0 10*3/uL (ref 0.0–0.1)
Basophils Relative: 0 %
Eosinophils Absolute: 0.1 10*3/uL (ref 0.0–0.5)
Eosinophils Relative: 1 %
HCT: 38.2 % (ref 36.0–46.0)
Hemoglobin: 12.8 g/dL (ref 12.0–15.0)
Immature Granulocytes: 1 %
Lymphocytes Relative: 22 %
Lymphs Abs: 2 10*3/uL (ref 0.7–4.0)
MCH: 30 pg (ref 26.0–34.0)
MCHC: 33.5 g/dL (ref 30.0–36.0)
MCV: 89.5 fL (ref 80.0–100.0)
Monocytes Absolute: 0.5 10*3/uL (ref 0.1–1.0)
Monocytes Relative: 6 %
Neutro Abs: 6.4 10*3/uL (ref 1.7–7.7)
Neutrophils Relative %: 70 %
Platelets: 319 10*3/uL (ref 150–400)
RBC: 4.27 MIL/uL (ref 3.87–5.11)
RDW: 12.5 % (ref 11.5–15.5)
WBC: 9 10*3/uL (ref 4.0–10.5)
nRBC: 0 % (ref 0.0–0.2)

## 2022-08-03 LAB — URINALYSIS, ROUTINE W REFLEX MICROSCOPIC
Bacteria, UA: NONE SEEN
Bilirubin Urine: NEGATIVE
Glucose, UA: NEGATIVE mg/dL
Ketones, ur: NEGATIVE mg/dL
Leukocytes,Ua: NEGATIVE
Nitrite: NEGATIVE
Protein, ur: NEGATIVE mg/dL
Specific Gravity, Urine: 1.008 (ref 1.005–1.030)
pH: 7 (ref 5.0–8.0)

## 2022-08-03 LAB — CHLAMYDIA/NGC RT PCR (ARMC ONLY)
Chlamydia Tr: NOT DETECTED
N gonorrhoeae: NOT DETECTED

## 2022-08-03 LAB — LIPASE, BLOOD: Lipase: 25 U/L (ref 11–51)

## 2022-08-03 MED ORDER — MAGNESIUM CITRATE PO SOLN
1.0000 | Freq: Once | ORAL | Status: AC
  Administered 2022-08-03: 1 via ORAL
  Filled 2022-08-03: qty 296

## 2022-08-03 MED ORDER — IOHEXOL 300 MG/ML  SOLN
80.0000 mL | Freq: Once | INTRAMUSCULAR | Status: AC | PRN
  Administered 2022-08-03: 80 mL via INTRAVENOUS

## 2022-08-03 MED ORDER — KETOROLAC TROMETHAMINE 15 MG/ML IJ SOLN
15.0000 mg | Freq: Once | INTRAMUSCULAR | Status: AC
  Administered 2022-08-03: 15 mg via INTRAVENOUS
  Filled 2022-08-03: qty 1

## 2022-08-03 NOTE — ED Notes (Signed)
Pt DC to home. DC instructions reviewed with all questions answered. Medication given for pt to home use. IV removed, cath intact, pressure dressing applied, no bleeding noted at site.  Pt ambulatory out of dept with steady gait.

## 2022-08-03 NOTE — ED Triage Notes (Signed)
Pt coming from [facility] for Flank and Abdominal pain starting one week. Pt had UA and blood was found in the urine on Saturday.   Pt endorses frequency. Pt denies any other urinary symptoms. Pt denies fever or chills.   Pt stating that pain has spread to LLQ pain. PT denies any GI concerns. PT denies PMH of kidney stones or UTI.

## 2022-08-03 NOTE — Discharge Instructions (Addendum)
Use Motrin for pain.  Take 3 of the over-the-counter pills 3 times a day.  This may help if it is the hemorrhagic cyst.   .  It is possible that you may have some constipation.  Sometimes this makes people have bad belly pain also. You can try to drink the bottle of magnesium citrate I gave you. That should give you a stool/bowel movement in a few hours.  You could also try a fleets enema or an over-the-counter laxative from the pharmacist.  This may help.  Please follow-up with your primary care doctor and/or OB doctor in the next couple days.  Dr. Jean Rosenthal from [facility] said he could follow-up with you if you cannot get your regular OB doctor do it.

## 2022-08-03 NOTE — Consult Note (Signed)
Consult Note  Requesting Attending Physician :  Arnaldo Natal, MD Service Requesting Consult : Emergency Department  Assessment/Recommendations:  Crystal Montgomery is a 36 y.o. female seen in consultation at the request of Arnaldo Natal, MD regarding some abdominal pain that has already been worked up by the Emergency Department. She is a 36 year old multipara, who has already had blood work, an ultrasound, and a CT scan. Her ultrasound indicated normal anatomy, no abnormal uterine findings. There is note of a 2.5 cm hemorrhagic cyst by  the left ovary.  Impression: Based on pelvic ultrasound, we do not feel that her complaint is a gynecologic related issue.  Active Problems:   * No active hospital problems. *        Reason for Consult:  to discuss the ultrasound findings with the patient   Allergies:no known   Medications: daily prenatal vitamins See ED note    Surgical History: No surgical hx  Obstetric History:  OB History  Gravida Para Term Preterm AB Living  2            SAB IAB Ectopic Multiple Live Births               # Outcome Date GA Lbr Len/2nd Weight Sex Delivery Anes PTL Lv  2 Gravida           1 Gravida             GYN History: Hx of Abnormal Pap Smears: yes , has had CIN1 with colposcopy, LEEP hx - (2017) Hx of STIs: C3  Social History: Social History   Socioeconomic History   Marital status: Single    Spouse name: Not on file   Number of children: Not on file   Years of education: Not on file   Highest education level: Not on file  Occupational History   Not on file  Tobacco Use   Smoking status: Never   Smokeless tobacco: Not on file  Substance and Sexual Activity   Alcohol use: No   Drug use: Never   Sexual activity: Not on file  Other Topics Concern   Not on file  Social History Narrative   Not on file   Social Determinants of Health   Financial Resource Strain: Not on file  Food Insecurity: Not on file   Transportation Needs: Not on file  Physical Activity: Not on file  Stress: Not on file  Social Connections: Not on file    Family History: family history is not on file.  Review of Systems: Review of Systems  Constitutional: Negative.   HENT: Negative.    Eyes: Negative.   Cardiovascular: Negative.   Gastrointestinal:  Positive for abdominal pain.  Genitourinary: Negative.   Musculoskeletal: Negative.   Skin: Negative.   Neurological: Negative.   Endo/Heme/Allergies: Negative.   Psychiatric/Behavioral: Negative.       Objective :  Vital signs in last 24 hours: Temp:  [98.2 F (36.8 C)-98.5 F (36.9 C)] 98.2 F (36.8 C) (08/28 2113) Pulse Rate:  [65-73] 73 (08/28 2113) Resp:  [12-16] 16 (08/28 2113) BP: (139-145)/(62-101) 139/62 (08/28 2113) SpO2:  [98 %-99 %] 98 % (08/28 2113) Weight:  [58.1 kg] 58.1 kg (08/28 1730)  Intake/Output last 3 shifts: No intake/output data recorded. Results for orders placed or performed during the hospital encounter of 08/03/22 (from the past 24 hour(s))  Urinalysis, Routine w reflex microscopic Urine, Clean Catch     Status: Abnormal   Collection  Time: 08/03/22  2:45 PM  Result Value Ref Range   Color, Urine STRAW (A) YELLOW   APPearance CLEAR (A) CLEAR   Specific Gravity, Urine 1.008 1.005 - 1.030   pH 7.0 5.0 - 8.0   Glucose, UA NEGATIVE NEGATIVE mg/dL   Hgb urine dipstick SMALL (A) NEGATIVE   Bilirubin Urine NEGATIVE NEGATIVE   Ketones, ur NEGATIVE NEGATIVE mg/dL   Protein, ur NEGATIVE NEGATIVE mg/dL   Nitrite NEGATIVE NEGATIVE   Leukocytes,Ua NEGATIVE NEGATIVE   RBC / HPF 0-5 0 - 5 RBC/hpf   WBC, UA 0-5 0 - 5 WBC/hpf   Bacteria, UA NONE SEEN NONE SEEN   Squamous Epithelial / LPF 0-5 0 - 5   Mucus PRESENT   POC urine preg, ED     Status: None   Collection Time: 08/03/22  2:58 PM  Result Value Ref Range   Preg Test, Ur Negative Negative  Comprehensive metabolic panel     Status: Abnormal   Collection Time: 08/03/22   3:32 PM  Result Value Ref Range   Sodium 138 135 - 145 mmol/L   Potassium 3.6 3.5 - 5.1 mmol/L   Chloride 106 98 - 111 mmol/L   CO2 26 22 - 32 mmol/L   Glucose, Bld 106 (H) 70 - 99 mg/dL   BUN 8 6 - 20 mg/dL   Creatinine, Ser 0.46 0.44 - 1.00 mg/dL   Calcium 9.2 8.9 - 10.3 mg/dL   Total Protein 8.1 6.5 - 8.1 g/dL   Albumin 4.4 3.5 - 5.0 g/dL   AST 19 15 - 41 U/L   ALT 17 0 - 44 U/L   Alkaline Phosphatase 66 38 - 126 U/L   Total Bilirubin 0.7 0.3 - 1.2 mg/dL   GFR, Estimated >60 >60 mL/min   Anion gap 6 5 - 15  Lipase, blood     Status: None   Collection Time: 08/03/22  3:32 PM  Result Value Ref Range   Lipase 25 11 - 51 U/L  CBC with Diff     Status: None   Collection Time: 08/03/22  3:32 PM  Result Value Ref Range   WBC 9.0 4.0 - 10.5 K/uL   RBC 4.27 3.87 - 5.11 MIL/uL   Hemoglobin 12.8 12.0 - 15.0 g/dL   HCT 38.2 36.0 - 46.0 %   MCV 89.5 80.0 - 100.0 fL   MCH 30.0 26.0 - 34.0 pg   MCHC 33.5 30.0 - 36.0 g/dL   RDW 12.5 11.5 - 15.5 %   Platelets 319 150 - 400 K/uL   nRBC 0.0 0.0 - 0.2 %   Neutrophils Relative % 70 %   Neutro Abs 6.4 1.7 - 7.7 K/uL   Lymphocytes Relative 22 %   Lymphs Abs 2.0 0.7 - 4.0 K/uL   Monocytes Relative 6 %   Monocytes Absolute 0.5 0.1 - 1.0 K/uL   Eosinophils Relative 1 %   Eosinophils Absolute 0.1 0.0 - 0.5 K/uL   Basophils Relative 0 %   Basophils Absolute 0.0 0.0 - 0.1 K/uL   Immature Granulocytes 1 %   Abs Immature Granulocytes 0.07 0.00 - 0.07 K/uL      Physical Exam:Physical Exam Constitutional:      Appearance: She is normal weight.  Abdominal:     General: Abdomen is flat. There is abdominal bruit.     Comments: Moderate pain felt near her umbilicus, running downward toward her left adnexal area.   Genitourinary:    Comments: No vaginal bleeding.  Pelvic deferred in light of the ultrasound report. Neurological:     General: No focal deficit present.     Mental Status: She is alert.  Psychiatric:        Mood and Affect:  Mood normal.        Behavior: Behavior normal.     Ultrasound report- see  imaging report- normal uterus, and ovaries, Uterus is retroverted. 2.5 x 1.2 x 1.3 cm hemorrhagic appearing cyst noted near her left ovary.Trace amount of free fluid seen    A: Normal appearing uterus Ovarian cyst  P: In light of overall testing and imaging reports, we do not feel that the patient's complaint appears to be gynecologic. This opinion is discussed with the ED physician, Dr. Darnelle Catalan. Who agrees.  Mirna Mires, CNM  08/03/2022 10:56 PM     Mirna Mires, CNM  08/03/2022 10:47 PM   08/03/2022 10:36 PM

## 2022-08-03 NOTE — ED Triage Notes (Signed)
First Nurse Note:  Arrives from [facility] with C/O right flank pain.  Seen on Saturday and blood found in urine, returned to [facility] today with c/o flank pain, here to r/o renal colic.  AAOx3.  Skin warm and dry. NAD

## 2022-08-03 NOTE — ED Provider Notes (Addendum)
[facility] Provider Note    Event Date/Time   First MD Initiated Contact with Patient 08/03/22 1556     (approximate)   History   Abdominal Pain   HPI  Crystal Montgomery is a 36 y.o. female with about a week of left-sided low back pain that a day or 2 ago has migrated up to the front of the belly as well.  There is tenderness to palpation and percussion in the left side of the abdomen especially left of the umbilicus.  Patient has no urgency or dysuria.  Patient has no fever      Physical Exam   Triage Vital Signs: ED Triage Vitals  Enc Vitals Group     BP 08/03/22 1442 (!) 145/101     Pulse Rate 08/03/22 1442 65     Resp 08/03/22 1442 12     Temp 08/03/22 1442 98.5 F (36.9 C)     Temp src --      SpO2 08/03/22 1442 99 %     Weight 08/03/22 1443 128 lb (58.1 kg)     Height --      Head Circumference --      Peak Flow --      Pain Score 08/03/22 1442 8     Pain Loc --      Pain Edu? --      Excl. in GC? --     Most recent vital signs: Vitals:   08/03/22 1442 08/03/22 2113  BP: (!) 145/101 139/62  Pulse: 65 73  Resp: 12 16  Temp: 98.5 F (36.9 C) 98.2 F (36.8 C)  SpO2: 99% 98%    General: Awake, no distress.  CV:  Good peripheral perfusion.  Resp:  Normal effort.  Lungs are clear Abd:  No distention.  Tender to palpation and percussion to the left of the umbilicus.  There is no organomegaly palpable there is no CVA tenderness tenderness is below the CVA area in the back and anteriorly Extremities with no edema   ED Results / Procedures / Treatments   Labs (all labs ordered are listed, but only abnormal results are displayed) Labs Reviewed  COMPREHENSIVE METABOLIC PANEL - Abnormal; Notable for the following components:      Result Value   Glucose, Bld 106 (*)    All other components within normal limits  URINALYSIS, ROUTINE W REFLEX MICROSCOPIC - Abnormal; Notable for the following components:   Color, Urine STRAW (*)     APPearance CLEAR (*)    Hgb urine dipstick SMALL (*)    All other components within normal limits  CHLAMYDIA/NGC RT PCR ([facility] ONLY)            LIPASE, BLOOD  CBC WITH DIFFERENTIAL/PLATELET  POC URINE PREG, ED     EKG     RADIOLOGY CT of the abdomen pelvis without contrast CT of the abdomen pelvis with contrast and ultrasound of pelvis read by radiology and reviewed and interpreted by me do not show anything except for the apparent hemorrhagic ovarian cyst and some extra fluid in the pelvis.  PROCEDURES:  Critical Care performed:   Procedures   MEDICATIONS ORDERED IN ED: Medications  magnesium citrate solution 1 Bottle (has no administration in time range)  iohexol (OMNIPAQUE) 300 MG/ML solution 80 mL (80 mLs Intravenous Contrast Given 08/03/22 1756)  ketorolac (TORADOL) 15 MG/ML injection 15 mg (15 mg Intravenous Given 08/03/22 2205)     IMPRESSION / MDM / ASSESSMENT AND  PLAN / ED COURSE  I reviewed the triage vital signs and the nursing notes. ----------------------------------------- 9:30 PM on 08/03/2022 ----------------------------------------- It took a little bit of time to get OB/GYN.  Initially we got Dr. Jean Rosenthal who was not on-call but answered for [place].  He then was able to get Dr. Logan Bores for [place].  Discussed the CT findings and ultrasound findings with both Dr. Jean Rosenthal and Dr. Logan Bores.  Dr. Logan Bores will send a nurse midwife down to check the lady and take over from there.  She will at least need follow-up and some pain medicine.  Differential diagnosis includes, but is not limited to, ovarian torsion, PID, ruptured or hemorrhagic cyst which is the most likely diagnosis right now, diverticulitis was a consideration, intussusception or volvulus work considerations constipation is a possibility but she is much more tender than that would appear to make 1.  Patient's presentation is most consistent with acute complicated illness / injury requiring diagnostic workup.  Pt  seen by nurse midwife who thinks it is more likely to be GI pain. I will give her a bottle of magnesium citrate since she does have a fair amount to stool in the colon. We will have her follow up with her primary care and OB   FINAL CLINICAL IMPRESSION(S) / ED DIAGNOSES   Final diagnoses:  Left lower quadrant abdominal pain     Rx / DC Orders   ED Discharge Orders     None        Note:  This document was prepared using Dragon voice recognition software and may include unintentional dictation errors.   Arnaldo Natal, MD 08/03/22 2236    Arnaldo Natal, MD 08/03/22 2241

## 2023-09-06 ENCOUNTER — Other Ambulatory Visit: Payer: Self-pay | Admitting: Family Medicine

## 2023-09-06 DIAGNOSIS — Z3480 Encounter for supervision of other normal pregnancy, unspecified trimester: Secondary | ICD-10-CM

## 2023-09-06 DIAGNOSIS — O4691 Antepartum hemorrhage, unspecified, first trimester: Secondary | ICD-10-CM

## 2023-09-07 ENCOUNTER — Ambulatory Visit
Admission: RE | Admit: 2023-09-07 | Discharge: 2023-09-07 | Disposition: A | Discharge status: Home / Self Care | Payer: Self-pay | Source: Ambulatory Visit | Attending: Family Medicine | Admitting: Family Medicine

## 2023-09-07 DIAGNOSIS — O4691 Antepartum hemorrhage, unspecified, first trimester: Secondary | ICD-10-CM | POA: Insufficient documentation

## 2023-09-07 DIAGNOSIS — Z3480 Encounter for supervision of other normal pregnancy, unspecified trimester: Secondary | ICD-10-CM | POA: Insufficient documentation

## 2023-09-07 DIAGNOSIS — O209 Hemorrhage in early pregnancy, unspecified: Secondary | ICD-10-CM | POA: Insufficient documentation

## 2023-09-07 DIAGNOSIS — Z3A12 12 weeks gestation of pregnancy: Secondary | ICD-10-CM | POA: Insufficient documentation

## 2023-09-27 ENCOUNTER — Other Ambulatory Visit: Payer: Self-pay | Admitting: Family Medicine

## 2023-09-27 DIAGNOSIS — Z3482 Encounter for supervision of other normal pregnancy, second trimester: Secondary | ICD-10-CM

## 2024-06-07 ENCOUNTER — Encounter: Payer: Self-pay | Admitting: Primary Care

## 2024-06-07 ENCOUNTER — Telehealth: Payer: Self-pay | Admitting: *Deleted
